# Patient Record
Sex: Male | Born: 1975 | Hispanic: Yes | Marital: Single | Smoking: Never smoker
Health system: Southern US, Community
[De-identification: ages and names within clinical notes are randomized; demographics above are authoritative.]

## PROBLEM LIST (undated history)

## (undated) DIAGNOSIS — Z789 Other specified health status: Secondary | ICD-10-CM

## (undated) HISTORY — DX: Other specified health status: Z78.9

## (undated) HISTORY — PX: OTHER SURGICAL HISTORY: SHX169

---

## 2008-12-12 ENCOUNTER — Emergency Department (HOSPITAL_COMMUNITY): Admission: EM | Admit: 2008-12-12 | Discharge: 2008-12-12 | Payer: Self-pay | Admitting: Emergency Medicine

## 2009-08-23 ENCOUNTER — Emergency Department (HOSPITAL_COMMUNITY): Admission: EM | Admit: 2009-08-23 | Discharge: 2009-08-23 | Payer: Self-pay | Admitting: Emergency Medicine
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
## 2011-03-07 LAB — BASIC METABOLIC PANEL
BUN: 14 mg/dL (ref 6–23)
CO2: 19 mEq/L (ref 19–32)
Calcium: 10 mg/dL (ref 8.4–10.5)
Chloride: 105 mEq/L (ref 96–112)
GFR calc Af Amer: >60 mL/min (ref >60)
GFR calc non Af Amer: >60 mL/min (ref >60)
Sodium: 137 mEq/L (ref 135–145)

## 2011-03-07 LAB — DIFFERENTIAL
Eosinophils Absolute: 0.3 10*3/uL (ref 0.0–0.7)
Lymphocytes Relative: 35 % (ref 12–46)
Monocytes Relative: 8 % (ref 3–12)

## 2011-03-07 LAB — CBC
Hemoglobin: 14.9 g/dL (ref 13.0–17.0)
MCHC: 33.7 g/dL (ref 30.0–36.0)
Platelets: 299 10*3/uL (ref 150–400)
RBC: 5 MIL/uL (ref 4.22–5.81)
WBC: 9.4 10*3/uL (ref 4.0–10.5)

## 2011-03-07 LAB — POCT CARDIAC MARKERS
CKMB, poc: <1 ng/mL — ABNORMAL LOW (ref 1.0–8.0)
Troponin i, poc: <0.05 ng/mL (ref 0.00–0.09)

## 2011-03-07 LAB — ETHANOL: Alcohol, Ethyl (B): <5 mg/dL (ref 0–10)

## 2014-06-14 ENCOUNTER — Encounter (HOSPITAL_COMMUNITY): Payer: Self-pay | Admitting: Emergency Medicine

## 2014-06-14 ENCOUNTER — Emergency Department (HOSPITAL_COMMUNITY)
Admission: EM | Admit: 2014-06-14 | Discharge: 2014-06-14 | Disposition: A | Discharge status: Home / Self Care | Payer: Self-pay | Attending: Emergency Medicine | Admitting: Emergency Medicine

## 2014-06-14 DIAGNOSIS — H9209 Otalgia, unspecified ear: Secondary | ICD-10-CM | POA: Insufficient documentation

## 2014-06-14 DIAGNOSIS — H60399 Other infective otitis externa, unspecified ear: Secondary | ICD-10-CM | POA: Insufficient documentation

## 2014-06-14 DIAGNOSIS — H6092 Unspecified otitis externa, left ear: Secondary | ICD-10-CM

## 2014-06-14 LAB — CBG MONITORING, ED: GLUCOSE-CAPILLARY: 102 mg/dL — AB (ref 70–99)

## 2014-06-14 MED ORDER — NEOMYCIN-POLYMYXIN-HC 1 % OT SOLN
4.0000 [drp] | Freq: Four times a day (QID) | OTIC | Status: DC
  Administered 2014-06-14: 4 [drp] via OTIC
  Filled 2014-06-14: qty 10

## 2014-06-14 NOTE — ED Provider Notes (Signed)
CSN: [id number]     Arrival date & time 06/14/14  0126 History   First MD Initiated Contact with Patient 06/14/14 [phone]     Chief Complaint  Patient presents with  . Earache      left     (Consider location/radiation/quality/duration/timing/severity/associated sxs/prior Treatment) HPI This is a 38 year old male without significant past medical history. He is here with a two-day history of pain in his left ear associated with drainage. He denies swimming. His pain is moderate to severe worse with movement of the ear. The pain radiates to the left side of the face around the ear. He denies fever.  History reviewed. No pertinent past medical history. History reviewed. No pertinent past surgical history. History reviewed. No pertinent family history. History  Substance Use Topics  . Smoking status: Never Smoker   . Smokeless tobacco: Not on file  . Alcohol Use: No    Review of Systems  All other systems reviewed and are negative.   Allergies  Review of patient's allergies indicates no known allergies.  Home Medications   Prior to Admission medications   Not on File   BP 146/87  Pulse 60  Temp(Src) 98 F (36.7 C) (Oral)  Resp 18  Ht 5\' 2"  (1.575 m)  Wt 160 lb (72.576 kg)  BMI 29.26 kg/m2  SpO2 100%  Physical Exam General: Well-developed, well-nourished male in no acute distress; appearance consistent with age of record HENT: normocephalic; atraumatic; right TM and external auditory canal normal; pain on movement of left external ear, edema and fluid of left external auditory canal, no periauricular erythema or lymphadenopathy Eyes: pupils equal, round and reactive to light; extraocular muscles intact Neck: supple; no lymphadenopathy  Heart: regular rate and rhythm Lungs: clear to auscultation bilaterally Abdomen: soft; nondistended; nontender Extremities: No deformity; full range of motion; pulses normal Neurologic: Awake, alert and oriented; motor function intact in  all extremities and symmetric; no facial droop Skin: Warm and dry Psychiatric: Normal mood and affect    ED Course  Procedures (including critical care time)  MDM  CBG 102    Carlisle BeersJohn L Khyri Hinzman, MD 06/14/14 0244

## 2014-06-14 NOTE — ED Notes (Signed)
Patient states his left ear started hurting Friday morning 06/13/2014. Patients states pain into left jaw/mouth and left neck pain. Patient states it hurts to open mouth wide. Patient also states decreased hearing to let ear

## 2014-06-14 NOTE — ED Notes (Signed)
Patient provided discharge instructions in spanish and verbalizes understanding of medication administration and pain management and home care. Patient ambulatory out of department at this time

## 2014-07-24 ENCOUNTER — Encounter (HOSPITAL_COMMUNITY): Payer: Self-pay | Admitting: Emergency Medicine

## 2014-07-24 ENCOUNTER — Emergency Department (HOSPITAL_COMMUNITY)
Admission: EM | Admit: 2014-07-24 | Discharge: 2014-07-24 | Disposition: A | Discharge status: Home / Self Care | Payer: BC Managed Care – PPO | Attending: Emergency Medicine | Admitting: Emergency Medicine

## 2014-07-24 DIAGNOSIS — K089 Disorder of teeth and supporting structures, unspecified: Secondary | ICD-10-CM | POA: Diagnosis present

## 2014-07-24 DIAGNOSIS — K0889 Other specified disorders of teeth and supporting structures: Secondary | ICD-10-CM

## 2014-07-24 MED ORDER — PENICILLIN V POTASSIUM 500 MG PO TABS: 500.0000 mg | ORAL_TABLET | Freq: Four times a day (QID) | ORAL | Status: AC

## 2014-07-24 MED ORDER — OXYCODONE-ACETAMINOPHEN 5-325 MG PO TABS
1.0000 | ORAL_TABLET | Freq: Once | ORAL | Status: AC
  Administered 2014-07-24: 1 via ORAL
  Filled 2014-07-24: qty 1

## 2014-07-24 MED ORDER — IBUPROFEN 800 MG PO TABS
800.0000 mg | ORAL_TABLET | Freq: Once | ORAL | Status: AC
  Administered 2014-07-24: 800 mg via ORAL
  Filled 2014-07-24: qty 1

## 2014-07-24 NOTE — ED Notes (Signed)
Patient c/o left lower tooth pain.  Patient holds up his hand and wiggles his fingers and states he can't move his hands or his body.  Patient stood from wheelchair and walked to bed.

## 2014-07-24 NOTE — Discharge Instructions (Signed)

## 2014-07-24 NOTE — ED Provider Notes (Signed)
CSN: [id number]     Arrival date & time 07/24/14  0026 History  This chart was scribed for Joya Gaskins, MD by Bronson Curb, ED Scribe. This patient was seen in room APA06/AP[facility]06 and the patient's care was started at 12:45 AM.    Chief Complaint  Patient presents with  . Dental Pain    Patient is a 38 y.o. male presenting with tooth pain. The history is provided by the patient. No language interpreter was used.  Dental Pain Location:  Lower Quality:  Radiating Severity:  Moderate Onset quality:  Sudden Timing:  Constant Progression:  Improving Chronicity:  New Relieved by:  None tried Worsened by:  Nothing tried Ineffective treatments:  None tried Associated symptoms: facial pain   Associated symptoms: no difficulty swallowing, no fever and no neck pain   Risk factors: no cancer and no diabetes     HPI Comments: Joseph Lester is a 38 y.o. male who presents to the Emergency Department complaining of constant, left lower dental pain onset 45 minutes ago. He states the pain radiates to his face and left eye. He reports the pain has improved since his arrival. He denies any modifying factors. Patient has no history of significant health conditions. Patient is a nonsmoker and has no history of EtOH consumption.  History reviewed. No pertinent past medical history. History reviewed. No pertinent past surgical history. No family history on file. History  Substance Use Topics  . Smoking status: Never Smoker   . Smokeless tobacco: Not on file  . Alcohol Use: No    Review of Systems  Constitutional: Negative for fever.  Musculoskeletal: Negative for neck pain.      Allergies  Review of patient's allergies indicates no known allergies.  Home Medications   Prior to Admission medications   Not on File   Triage Vitals: BP 137/89  Pulse 89  Temp(Src) 98.1 F (36.7 C) (Oral)  Resp 18  Ht  (1.549 m)  Wt 160 lb (72.576 kg)  BMI 30.25 kg/m2  SpO2  95%  Physical Exam CONSTITUTIONAL: Well developed/well nourished HEAD AND FACE: Normocephalic/atraumatic EYES: EOMI/PERRL ENMT: Mucous membranes moist.  Poor dentition.  No trismus.  No focal abscess noted. NECK: supple no meningeal signs CV: S1/S2 noted, no murmurs/rubs/gallops noted LUNGS: Lungs are clear to auscultation bilaterally, no apparent distress ABDOMEN: soft, nontender, no rebound or guarding NEURO: Pt is awake/alert, moves all extremitiesx4. No facial droop. No arm weakness noted. He is ambulatory EXTREMITIES:full ROM SKIN: warm, color normal   ED Course  Procedures  DIAGNOSTIC STUDIES: Oxygen Saturation is 95% on room air, adequate by my interpretation.    COORDINATION OF CARE: At 71 Discussed treatment plan with patient which includes Percocet and penicillin. Patient agrees.  Pt told nurse he couldn't move his body He told me he has had this previously but feels he can move his body currently He is ambulatory, no focal neuro deficits noted Stable for d/c home MDM   Final diagnoses:  Pain, dental    Nursing notes including past medical history and social history reviewed and considered in documentation   I personally performed the services described in this documentation, which was scribed in my presence. The recorded information has been reviewed and is accurate.      Joya Gaskins, MD 07/24/14 [phone]

## 2016-08-24 DIAGNOSIS — M25511 Pain in right shoulder: Secondary | ICD-10-CM

## 2016-08-24 DIAGNOSIS — M25512 Pain in left shoulder: Principal | ICD-10-CM

## 2016-08-24 LAB — GLUCOSE, POCT (MANUAL RESULT ENTRY): POC Glucose: 115 mg/dl — AB (ref 70–99)
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
## 2016-08-30 ENCOUNTER — Ambulatory Visit: Payer: Self-pay | Admitting: Physician Assistant

## 2016-08-31 ENCOUNTER — Telehealth: Payer: Self-pay

## 2016-08-31 NOTE — Telephone Encounter (Signed)
Follow up call was made on 08/31/16 at 9:54 am. Patient was in the [facility] on 08/24/16 with complaints of shoulder pain and back pain due to a work injury and wanted to have some blood work. [facility] was [facility] an appointment for 08/30/16 at 1:30. As I was talking to the patient, he said he was doing good at the moment. He also mentioned he called to cancel his appointment with the [facility] due to his job not giving him the day off. I advised him to call and rescheduled. I assured him that he will be able to leave a message since the Clinic has Spanish prompts and a there is a  Designer, multimediaBilingual nurse at the facility.   BogardBlanca Korryn Pancoast, [place] [id number] [phone]

## 2016-09-06 ENCOUNTER — Encounter: Payer: Self-pay | Admitting: Physician Assistant

## 2018-08-13 ENCOUNTER — Encounter (HOSPITAL_COMMUNITY): Payer: Self-pay | Admitting: Emergency Medicine

## 2018-08-13 ENCOUNTER — Emergency Department (HOSPITAL_COMMUNITY)
Admission: EM | Admit: 2018-08-13 | Discharge: 2018-08-13 | Disposition: A | Discharge status: Home / Self Care | Payer: Self-pay | Attending: Emergency Medicine | Admitting: Emergency Medicine

## 2018-08-13 DIAGNOSIS — R1010 Upper abdominal pain, unspecified: Secondary | ICD-10-CM | POA: Insufficient documentation

## 2018-08-13 LAB — COMPREHENSIVE METABOLIC PANEL
ALBUMIN: 4.4 g/dL (ref 3.5–5.0)
ALK PHOS: 75 U/L (ref 38–126)
ALT: 66 U/L — AB (ref 0–44)
AST: 39 U/L (ref 15–41)
Anion gap: 9 (ref 5–15)
BILIRUBIN TOTAL: 1.2 mg/dL (ref 0.3–1.2)
BUN: 12 mg/dL (ref 6–20)
CALCIUM: 9.1 mg/dL (ref 8.9–10.3)
CO2: 26 mmol/L (ref 22–32)
CREATININE: 0.67 mg/dL (ref 0.61–1.24)
Chloride: 103 mmol/L (ref 98–111)
GFR calc Af Amer: >60 mL/min (ref >60)
GFR calc non Af Amer: >60 mL/min (ref >60)
GLUCOSE: 108 mg/dL — AB (ref 70–99)
Potassium: 4 mmol/L (ref 3.5–5.1)
Sodium: 138 mmol/L (ref 135–145)
TOTAL PROTEIN: 7.8 g/dL (ref 6.5–8.1)

## 2018-08-13 LAB — CBC WITH DIFFERENTIAL/PLATELET
Basophils Absolute: 0 10*3/uL (ref 0.0–0.1)
Basophils Relative: 0 %
Eosinophils Absolute: 0.1 10*3/uL (ref 0.0–0.7)
Eosinophils Relative: 2 %
HCT: 44.1 % (ref 39.0–52.0)
Hemoglobin: 15.2 g/dL (ref 13.0–17.0)
Lymphocytes Relative: 24 %
Lymphs Abs: 1.5 10*3/uL (ref 0.7–4.0)
MCH: 30.1 pg (ref 26.0–34.0)
MCHC: 34.5 g/dL (ref 30.0–36.0)
MCV: 87.3 fL (ref 78.0–100.0)
Monocytes Absolute: 0.6 10*3/uL (ref 0.1–1.0)
Monocytes Relative: 10 %
Neutro Abs: 4 10*3/uL (ref 1.7–7.7)
Neutrophils Relative %: 64 %
Platelets: 256 10*3/uL (ref 150–400)
RBC: 5.05 MIL/uL (ref 4.22–5.81)
RDW: 12.6 % (ref 11.5–15.5)
WBC: 6.2 10*3/uL (ref 4.0–10.5)

## 2018-08-13 LAB — URINALYSIS, ROUTINE W REFLEX MICROSCOPIC
BILIRUBIN URINE: NEGATIVE
GLUCOSE, UA: NEGATIVE mg/dL
HGB URINE DIPSTICK: NEGATIVE
KETONES UR: NEGATIVE mg/dL
Leukocytes, UA: NEGATIVE
Nitrite: NEGATIVE
PROTEIN: NEGATIVE mg/dL
Specific Gravity, Urine: 1.018 (ref 1.005–1.030)
pH: 6 (ref 5.0–8.0)

## 2018-08-13 MED ORDER — ACETAMINOPHEN 500 MG PO TABS
1000.0000 mg | ORAL_TABLET | Freq: Once | ORAL | Status: AC
  Administered 2018-08-13: 1000 mg via ORAL
  Filled 2018-08-13: qty 2

## 2018-08-13 NOTE — Discharge Instructions (Signed)
Take Tylenol and Pepcid as needed for pain. Follow-up with local doctor and specialist as discussed.

## 2018-08-13 NOTE — ED Triage Notes (Signed)
Pt reports right sided abd pain for a few months now.  Was seen by pcp and given meds, but not helping.  No vomiting or diarrhea, and pain increases with eating.

## 2018-08-13 NOTE — ED Provider Notes (Signed)
[facility] EMERGENCY DEPARTMENT Provider Note   CSN: [id number] Arrival date & time: 08/13/18  0957     History   Chief Complaint Chief Complaint  Patient presents with  . Abdominal Pain    HPI Joseph Lester is a 42 y.o. male.  Patient presents with recurrent abdominal pain for over 2 months.  Patient saw primary doctor was given meds however they did not help.  Patient denies fevers chills or vomiting.  Pain fairly constant currently mild.  Patient's had mild dysuria.  No new sexual partners.  Pain worse with different foods.  Pain location upper abdomen right upper quadrant.     History reviewed. No pertinent past medical history.  There are no active problems to display for this patient.   History reviewed. No pertinent surgical history.      Home Medications    Prior to Admission medications   Not on File    Family History History reviewed. No pertinent family history.  Social History Social History   Tobacco Use  . Smoking status: Never Smoker  Substance Use Topics  . Alcohol use: No  . Drug use: No     Allergies   Patient has no known allergies.   Review of Systems Review of Systems  Constitutional: Negative for chills and fever.  HENT: Negative for congestion.   Eyes: Negative for visual disturbance.  Respiratory: Negative for shortness of breath.   Cardiovascular: Negative for chest pain.  Gastrointestinal: Positive for abdominal pain. Negative for vomiting.  Genitourinary: Positive for dysuria. Negative for flank pain.  Musculoskeletal: Negative for back pain, neck pain and neck stiffness.  Skin: Negative for rash.  Neurological: Negative for light-headedness and headaches.     Physical Exam Updated Vital Signs BP 115/83 (BP Location: Right Arm)   Pulse 72   Temp 98.2 F (36.8 C) (Oral)   Resp 16   Wt 70.8 kg   SpO2 100%   BMI 27.63 kg/m   Physical Exam  Constitutional: He is oriented to person, place, and time. He  appears well-developed and well-nourished.  HENT:  Head: Normocephalic and atraumatic.  Eyes: Conjunctivae are normal. Right eye exhibits no discharge. Left eye exhibits no discharge.  Neck: Normal range of motion. Neck supple. No tracheal deviation present.  Cardiovascular: Normal rate and regular rhythm.  Pulmonary/Chest: Effort normal.  Abdominal: Soft. He exhibits no distension. There is tenderness (minimal RUQ and epig). There is no guarding.  Musculoskeletal: He exhibits no edema.  Neurological: He is alert and oriented to person, place, and time.  Skin: Skin is warm. No rash noted.  Psychiatric: He has a normal mood and affect.  Nursing note and vitals reviewed.    ED Treatments / Results  Labs (all labs ordered are listed, but only abnormal results are displayed) Labs Reviewed  COMPREHENSIVE METABOLIC PANEL - Abnormal; Notable for the following components:      Result Value   Glucose, Bld 108 (*)    ALT 66 (*)    All other components within normal limits  CBC WITH DIFFERENTIAL/PLATELET  URINALYSIS, ROUTINE W REFLEX MICROSCOPIC  GC/CHLAMYDIA PROBE AMP ([place]) NOT AT [facility]    EKG None  Radiology No results found.  Procedures Ultrasound ED Abd Date/Time: 08/13/2018 12:31 PM Performed by: Blane Ohara, MD Authorized by: Blane Ohara, MD   Procedure details:    Indications: abdominal pain     Assessment for:  Gallstones   Hepatobiliary:  Visualized   Images: archived   Study  Limitations: bowel gas Hepatobiliary findings:    Gallbladder wall:  Normal   Gallbladder stones: not identified     Intra-abdominal fluid: not identified     (including critical care time)  Medications Ordered in ED Medications  acetaminophen (TYLENOL) tablet 1,000 mg (1,000 mg Oral Given 08/13/18 1145)     Initial Impression / Assessment and Plan / ED Course  I have reviewed the triage vital signs and the nursing notes.  Pertinent labs & imaging results that were  available during my care of the patient were reviewed by me and considered in my medical decision making (see chart for details).   Well-appearing male presents with recurrent abdominal pain for 2 months.  Patient blood work reviewed no significant findings, minimal elevation in LFTs.  Bedside ultrasound no gallstones or cholecystitis.  Discussed follow-up with primary doctor and gastroenterology.  Interpreter used for discussion and follow-up.  Results and differential diagnosis were discussed with the patient/parent/guardian. Xrays were independently reviewed by myself.  Close follow up outpatient was discussed, comfortable with the plan.   Medications  acetaminophen (TYLENOL) tablet 1,000 mg (1,000 mg Oral Given 08/13/18 1145)    Vitals:   08/13/18 1006 08/13/18 1007  BP: 115/83   Pulse: 72   Resp: 16   Temp: 98.2 F (36.8 C)   TempSrc: Oral   SpO2: 100%   Weight:  70.8 kg    Final diagnoses:  Upper abdominal pain     Final Clinical Impressions(s) / ED Diagnoses   Final diagnoses:  Upper abdominal pain    ED Discharge Orders    None       Blane OharaZavitz, Effa Yarrow, MD 08/13/18 1232

## 2018-08-14 LAB — GC/CHLAMYDIA PROBE AMP (~~LOC~~) NOT AT ARMC
CHLAMYDIA, DNA PROBE: NEGATIVE
Neisseria Gonorrhea: NEGATIVE

## 2018-08-20 ENCOUNTER — Encounter: Payer: Self-pay | Admitting: Internal Medicine

## 2018-11-29 ENCOUNTER — Other Ambulatory Visit: Payer: Self-pay | Admitting: *Deleted

## 2018-11-29 ENCOUNTER — Ambulatory Visit: Payer: Self-pay | Admitting: Gastroenterology

## 2018-11-29 ENCOUNTER — Encounter: Payer: Self-pay | Admitting: *Deleted

## 2018-11-29 ENCOUNTER — Other Ambulatory Visit (HOSPITAL_COMMUNITY)
Admission: RE | Admit: 2018-11-29 | Discharge: 2018-11-29 | Disposition: A | Discharge status: Home / Self Care | Payer: Self-pay | Source: Ambulatory Visit | Attending: Gastroenterology | Admitting: Gastroenterology

## 2018-11-29 ENCOUNTER — Encounter: Payer: Self-pay | Admitting: Gastroenterology

## 2018-11-29 VITALS — BP 104/58 | HR 69 | Temp 97.1°F | Ht 64.0 in | Wt 152.2 lb

## 2018-11-29 DIAGNOSIS — R1011 Right upper quadrant pain: Secondary | ICD-10-CM | POA: Insufficient documentation

## 2018-11-29 DIAGNOSIS — R300 Dysuria: Secondary | ICD-10-CM

## 2018-11-29 LAB — HEPATIC FUNCTION PANEL
ALBUMIN: 4.3 g/dL (ref 3.5–5.0)
ALK PHOS: 81 U/L (ref 38–126)
ALT: 73 U/L — AB (ref 0–44)
AST: 39 U/L (ref 15–41)
Bilirubin, Direct: <0.1 mg/dL (ref 0.0–0.2)
TOTAL PROTEIN: 7.7 g/dL (ref 6.5–8.1)
Total Bilirubin: 0.8 mg/dL (ref 0.3–1.2)

## 2018-11-29 MED ORDER — PANTOPRAZOLE SODIUM 40 MG PO TBEC: 40.0000 mg | DELAYED_RELEASE_TABLET | Freq: Every day | ORAL | 3 refills | Status: AC

## 2018-11-29 NOTE — Patient Instructions (Addendum)
Please start take taking Protonix 30 minutes before breakfast daily. Take the coupon to [facility] with the prescription so you can have a 90 day supply for 20 dollars.   I have ordered blood work to have done at the hospital today.  We are arranging an ultrasound in the near future.  We have referred you to a urologist.   Further recommendations to follow!  It was a pleasure to see you today. I strive to create trusting relationships with patients to provide genuine, compassionate, and quality care. I value your feedback. If you receive a survey regarding your visit,  I greatly appreciate you taking time to fill this out.   Joseph Mink, PhD, ANP-BC [facility]    Por favor, comience a tomar Protonix 30 minutos antes del desayuno todos [place]. Tome el cupn a [facility] con la receta para que pueda tener un suministro de [address] por 20 dlares.   He ordenado anlisis de sangre que hayan hecho en el hospital hoy.  Estamos organizando una ecografa en un futuro prximo.  Lo hemos referido a Engineer, manufacturing systems.   Ms recomendaciones a seguir!  Fue un Arboriculturist. Me esfuerzo por crear relaciones de confianza con los pacientes para proporcionar una atencin [place], [place] y de calidad. Valoro sus comentarios. Si recibe una encuesta con respecto a su visita, le agradezco enormemente que se tome el tiempo para completar esto.   Joseph Mink, PhD, ANP-BC [facility] [place]

## 2018-11-29 NOTE — Progress Notes (Signed)
Primary Care Physician:  Patient, No Pcp Per  Referring Physician: [facility] Primary Gastroenterologist:  Dr. Jena Gauss   Chief Complaint  Patient presents with  . Abdominal Pain    right side x couple months    HPI:   Joseph Lester is a 43 y.o. male presenting today at the request of [facility] due to abdominal pain. He speaks a small amount of English but needs an interpreter. He was seen in the ED Sept 2019. HFP with isolated mildly elevated ALT at 66. CBC normal. Bedside ultrasound was performed without obvious stones or concern for cholecystitis.   New onset of pain Sept 2019. Pain located in RUQ and middle abdomen. Constant. Worsened with eating. Worsened with coffee and soda. Slightly worsened with fried foods. No N/V. Feels a "coldness" in throat, and food has no taste when pain is occurring. No dysphagia. No weight loss. Decreased appetite.   Urinary burning and pain. No PCP. Normal UA and negative GC/chlamydia in Sept 2019.     Past Medical History:  Diagnosis Date  . Medical history non-contributory     Past Surgical History:  Procedure Laterality Date  . none      No current outpatient medications on file.   No current facility-administered medications for this visit.     Allergies as of 11/29/2018  . (No Known Allergies)    Family History  Problem Relation Age of Onset  . Colon cancer Neg Hx   . Colon polyps Neg Hx     Social History   Socioeconomic History  . Marital status: Single    Spouse name: Not on file  . Number of children: Not on file  . Years of education: Not on file  . Highest education level: Not on file  Occupational History  . Not on file  Social Needs  . Financial resource strain: Not on file  . Food insecurity:    Worry: Not on file    Inability: Not on file  . Transportation needs:    Medical: Not on file    Non-medical: Not on file  Tobacco Use  . Smoking status: Never Smoker  . Smokeless tobacco: Never Used    Substance and Sexual Activity  . Alcohol use: No  . Drug use: No  . Sexual activity: Not on file  Lifestyle  . Physical activity:    Days per week: Not on file    Minutes per session: Not on file  . Stress: Not on file  Relationships  . Social connections:    Talks on phone: Not on file    Gets together: Not on file    Attends religious service: Not on file    Active member of club or organization: Not on file    Attends meetings of clubs or organizations: Not on file    Relationship status: Not on file  . Intimate partner violence:    Fear of current or ex partner: Not on file    Emotionally abused: Not on file    Physically abused: Not on file    Forced sexual activity: Not on file  Other Topics Concern  . Not on file  Social History Narrative  . Not on file    Review of Systems: Gen: see HPI CV: Denies chest pain, heart palpitations, peripheral edema, syncope.  Resp: Denies shortness of breath at rest or with exertion. Denies wheezing or cough.  GI: see HPI  GU : Denies urinary burning,  urinary frequency, urinary hesitancy MS: Denies joint pain, muscle weakness, cramps, or limitation of movement.  Derm: Denies rash, itching, dry skin Psych: Denies depression, anxiety, memory loss, and confusion Heme: Denies bruising, bleeding, and enlarged lymph nodes.  Physical Exam: BP (!) 104/58   Pulse 69   Temp (!) 97.1 F (36.2 C) (Oral)   Ht 5\' 4"  (1.626 m)   Wt 152 lb 3.2 oz (69 kg)   BMI 26.13 kg/m  General:   Alert and oriented. Pleasant and cooperative. Well-nourished and well-developed.  Head:  Normocephalic and atraumatic. Eyes:  Without icterus, sclera clear and conjunctiva pink.  Ears:  Normal auditory acuity. Nose:  No deformity, discharge,  or lesions. Mouth:  No deformity or lesions, oral mucosa pink.  Lungs:  Clear to auscultation bilaterally. No wheezes, rales, or rhonchi. No distress.  Heart:  S1, S2 present without murmurs appreciated.  Abdomen:  +BS,  soft, mild TTP epigastric and non-distended. No HSM noted. No guarding or rebound. No masses appreciated.  Rectal:  Deferred  Msk:  Symmetrical without gross deformities. Normal posture. Extremities:  Without edema. Neurologic:  Alert and  oriented x4 Psych:  Alert and cooperative. Normal mood and affect.   Ultrasound ED Abd Date/Time: 08/13/2018 12:31 PM Performed by: Blane OharaZavitz, Joshua, MD Authorized by: Blane OharaZavitz, Joshua, MD   Procedure details:    Indications: abdominal pain     Assessment for:  Gallstones   Hepatobiliary:  Visualized   Images: archived   Study Limitations: bowel gas Hepatobiliary findings:    Gallbladder wall:  Normal   Gallbladder stones: not identified     Intra-abdominal fluid: not identified

## 2018-12-03 NOTE — Assessment & Plan Note (Addendum)
43 year old with mildly isolated elevated ALT at 66. Limited ultrasound completed at bedside by ED. Differentials including biliary, less likely gastritis/PUD. To be thorough ordering US abdomen complete, recheck HFP, may need HIDA scan. Follow transaminases. May need to check hepatitis serologies as well. Starting Protonix once each day, with good rx coupon provided. Further recommendations to follow.   Due to urinary symptoms: refer to Urology. We have also provided a list of PCPs in the area to establish care.   Speaks very limited [place]. Entire visit completed with interpreter via telephone. For future visits, would highly suggest in-person interpreter if at all possible.

## 2018-12-06 NOTE — Progress Notes (Signed)
Isolated elevation of ALT. Awaiting ultrasound.

## 2018-12-07 ENCOUNTER — Ambulatory Visit (HOSPITAL_COMMUNITY)
Admission: RE | Admit: 2018-12-07 | Discharge: 2018-12-07 | Disposition: A | Discharge status: Home / Self Care | Payer: Self-pay | Source: Ambulatory Visit | Attending: Gastroenterology | Admitting: Gastroenterology

## 2018-12-07 DIAGNOSIS — R1011 Right upper quadrant pain: Secondary | ICD-10-CM | POA: Insufficient documentation

## 2018-12-13 ENCOUNTER — Other Ambulatory Visit: Payer: Self-pay | Admitting: Gastroenterology

## 2018-12-13 DIAGNOSIS — R1011 Right upper quadrant pain: Secondary | ICD-10-CM

## 2018-12-13 DIAGNOSIS — R740 Nonspecific elevation of levels of transaminase and lactic acid dehydrogenase [LDH]: Principal | ICD-10-CM

## 2018-12-13 DIAGNOSIS — R7401 Elevation of levels of liver transaminase levels: Secondary | ICD-10-CM

## 2018-12-13 NOTE — Progress Notes (Signed)
WILL NEED AN INTERPRETER: US abdomen with gallbladder polyps that are small. No gallstones. He has a fatty liver. His ALT remains elevated. He needs Hep C antibody, Hep B serologies,  Hep A antibody, which I have ordered and just need to be released from chart. I would also recommend a HIDA scan to be thorough, due to his postprandial RUQ pain, N/V, IF IT IS STILL PRESENT since starting Protonix. Hopefully, this has helped.

## 2018-12-13 NOTE — Progress Notes (Signed)
Labs entered. See result note from [place].

## 2018-12-14 ENCOUNTER — Other Ambulatory Visit: Payer: Self-pay

## 2018-12-14 DIAGNOSIS — R112 Nausea with vomiting, unspecified: Secondary | ICD-10-CM

## 2018-12-14 DIAGNOSIS — R7401 Elevation of levels of liver transaminase levels: Secondary | ICD-10-CM

## 2018-12-14 DIAGNOSIS — R740 Nonspecific elevation of levels of transaminase and lactic acid dehydrogenase [LDH]: Principal | ICD-10-CM

## 2018-12-14 DIAGNOSIS — R1011 Right upper quadrant pain: Secondary | ICD-10-CM

## 2018-12-21 ENCOUNTER — Ambulatory Visit (HOSPITAL_COMMUNITY)
Admission: RE | Admit: 2018-12-21 | Discharge: 2018-12-21 | Disposition: A | Discharge status: Home / Self Care | Payer: Self-pay | Source: Ambulatory Visit | Attending: Gastroenterology | Admitting: Gastroenterology

## 2018-12-21 ENCOUNTER — Other Ambulatory Visit: Payer: Self-pay | Admitting: Gastroenterology

## 2018-12-21 ENCOUNTER — Encounter (HOSPITAL_COMMUNITY): Payer: Self-pay

## 2018-12-21 DIAGNOSIS — R112 Nausea with vomiting, unspecified: Secondary | ICD-10-CM | POA: Insufficient documentation

## 2018-12-21 DIAGNOSIS — R1011 Right upper quadrant pain: Secondary | ICD-10-CM | POA: Insufficient documentation

## 2018-12-21 MED ORDER — SODIUM CHLORIDE 0.9% FLUSH
INTRAVENOUS | Status: AC
  Filled 2018-12-21: qty 40

## 2018-12-21 MED ORDER — STERILE WATER FOR INJECTION IJ SOLN
INTRAMUSCULAR | Status: AC
  Administered 2018-12-21: 1.38 mL via INTRAVENOUS
  Filled 2018-12-21: qty 10

## 2018-12-21 MED ORDER — SINCALIDE 5 MCG IJ SOLR
INTRAMUSCULAR | Status: AC
  Administered 2018-12-21: 1.38 ug via INTRAVENOUS
  Filled 2018-12-21: qty 5

## 2018-12-21 MED ORDER — TECHNETIUM TC 99M MEBROFENIN IV KIT
5.0000 | PACK | Freq: Once | INTRAVENOUS | Status: AC | PRN
  Administered 2018-12-21: 5.2 via INTRAVENOUS

## 2018-12-26 NOTE — Progress Notes (Signed)
HIDA scan was normal. EF of 65%. He still needs to complete labs. How is he doing on Protonix?

## 2019-01-02 NOTE — Progress Notes (Signed)
At this point, recommend continue Protonix, please complete labs, and we need to arrange EGD with Dr. Jena Gauss due to abdominal pain.

## 2019-01-03 ENCOUNTER — Telehealth: Payer: Self-pay

## 2019-01-03 NOTE — Telephone Encounter (Signed)
Per [facility], [facility] has tried to call pt to schedule appt but unable to leave VM. Called pt, he was driving, but he has [facility] phone# in is in phone where they tried to call. Advised him to call urology to schedule appt.

## 2019-01-09 ENCOUNTER — Other Ambulatory Visit: Payer: Self-pay

## 2019-01-09 DIAGNOSIS — R109 Unspecified abdominal pain: Secondary | ICD-10-CM

## 2019-02-28 ENCOUNTER — Telehealth: Payer: Self-pay | Admitting: *Deleted

## 2019-02-28 NOTE — Telephone Encounter (Signed)
ATC pt to move procedure time up to 8:30am on 03/08/2019. Patient VM is full.

## 2019-02-28 NOTE — Telephone Encounter (Signed)
Called interpreter line and spoke with [place] ID# [phone]. We spoke with patient and he is aware he needs to arrive on 03/08/2019 at 7:30am. Patient lost his instructions. Discussed instructions in detail with interpreter with patient.  Patient aware he will need to start clear liquids at 6pm on 03/07/2019 and can have clear liquids until midnight. Nothing after midnight until after procedure. He voiced understanding.

## 2019-03-08 ENCOUNTER — Ambulatory Visit (HOSPITAL_COMMUNITY)
Admission: RE | Admit: 2019-03-08 | Discharge: 2019-03-08 | Disposition: A | Discharge status: Home / Self Care | Payer: Self-pay | Attending: Internal Medicine | Admitting: Internal Medicine

## 2019-03-08 ENCOUNTER — Encounter (HOSPITAL_COMMUNITY)
Admission: RE | Disposition: A | Discharge status: Home / Self Care | Payer: Self-pay | Source: Home / Self Care | Attending: Internal Medicine

## 2019-03-08 ENCOUNTER — Other Ambulatory Visit: Payer: Self-pay

## 2019-03-08 ENCOUNTER — Encounter (HOSPITAL_COMMUNITY): Payer: Self-pay | Admitting: *Deleted

## 2019-03-08 DIAGNOSIS — K824 Cholesterolosis of gallbladder: Secondary | ICD-10-CM | POA: Insufficient documentation

## 2019-03-08 DIAGNOSIS — K760 Fatty (change of) liver, not elsewhere classified: Secondary | ICD-10-CM | POA: Insufficient documentation

## 2019-03-08 DIAGNOSIS — R1011 Right upper quadrant pain: Secondary | ICD-10-CM | POA: Insufficient documentation

## 2019-03-08 DIAGNOSIS — R109 Unspecified abdominal pain: Secondary | ICD-10-CM

## 2019-03-08 HISTORY — PX: ESOPHAGOGASTRODUODENOSCOPY: SHX5428

## 2019-03-08 LAB — HEPATIC FUNCTION PANEL
ALT: 76 U/L — ABNORMAL HIGH (ref 0–44)
AST: 46 U/L — ABNORMAL HIGH (ref 15–41)
Albumin: 3.7 g/dL (ref 3.5–5.0)
Alkaline Phosphatase: 99 U/L (ref 38–126)
Bilirubin, Direct: <0.1 mg/dL (ref 0.0–0.2)
Total Bilirubin: 1 mg/dL (ref 0.3–1.2)
Total Protein: 6.9 g/dL (ref 6.5–8.1)

## 2019-03-08 SURGERY — EGD (ESOPHAGOGASTRODUODENOSCOPY)
Anesthesia: Moderate Sedation

## 2019-03-08 MED ORDER — LIDOCAINE VISCOUS HCL 2 % MT SOLN
OROMUCOSAL | Status: DC | PRN
  Administered 2019-03-08: 1 via OROMUCOSAL

## 2019-03-08 MED ORDER — STERILE WATER FOR IRRIGATION IR SOLN
Status: DC | PRN
  Administered 2019-03-08: 1.5 mL

## 2019-03-08 MED ORDER — MEPERIDINE HCL 100 MG/ML IJ SOLN
INTRAMUSCULAR | Status: DC | PRN
  Administered 2019-03-08: 15 mg via INTRAVENOUS
  Administered 2019-03-08: 25 mg via INTRAVENOUS

## 2019-03-08 MED ORDER — MEPERIDINE HCL 50 MG/ML IJ SOLN
INTRAMUSCULAR | Status: AC
  Filled 2019-03-08: qty 1

## 2019-03-08 MED ORDER — LIDOCAINE VISCOUS HCL 2 % MT SOLN
OROMUCOSAL | Status: AC
  Filled 2019-03-08: qty 15

## 2019-03-08 MED ORDER — MIDAZOLAM HCL 5 MG/5ML IJ SOLN
INTRAMUSCULAR | Status: DC | PRN
  Administered 2019-03-08: 2 mg via INTRAVENOUS
  Administered 2019-03-08: 1 mg via INTRAVENOUS

## 2019-03-08 MED ORDER — ONDANSETRON HCL 4 MG/2ML IJ SOLN
INTRAMUSCULAR | Status: AC
  Filled 2019-03-08: qty 2

## 2019-03-08 MED ORDER — SODIUM CHLORIDE 0.9 % IV SOLN
INTRAVENOUS | Status: DC
  Administered 2019-03-08: 09:00:00 via INTRAVENOUS

## 2019-03-08 MED ORDER — ONDANSETRON HCL 4 MG/2ML IJ SOLN
INTRAMUSCULAR | Status: DC | PRN
  Administered 2019-03-08: 4 mg via INTRAVENOUS

## 2019-03-08 MED ORDER — MIDAZOLAM HCL 5 MG/5ML IJ SOLN
INTRAMUSCULAR | Status: AC
  Filled 2019-03-08: qty 10

## 2019-03-08 NOTE — H&P (Signed)
 @LOGO @   Primary Care Physician:  Gelene MinkBoone, Anna W, NP Primary Gastroenterologist:  Dr.   Pre-Procedure History & Physical: HPI:  Joseph Lester is a 43 y.o. male here for   Past Medical History:  Diagnosis Date  . Medical history non-contributory     Past Surgical History:  Procedure Laterality Date  . none      Prior to Admission medications   Medication Sig Start Date End Date Taking? Authorizing Provider  pantoprazole (PROTONIX) 40 MG tablet Take 1 tablet (40 mg total) by mouth daily before breakfast. Patient not taking: Reported on 02/22/2019 11/29/18   Gelene MinkBoone, Anna W, NP    Allergies as of 01/09/2019  . (No Known Allergies)    Family History  Problem Relation Age of Onset  . Colon cancer Neg Hx   . Colon polyps Neg Hx     Social History   Socioeconomic History  . Marital status: Single    Spouse name: Not on file  . Number of children: Not on file  . Years of education: Not on file  . Highest education level: Not on file  Occupational History  . Not on file  Social Needs  . Financial resource strain: Not on file  . Food insecurity:    Worry: Not on file    Inability: Not on file  . Transportation needs:    Medical: Not on file    Non-medical: Not on file  Tobacco Use  . Smoking status: Never Smoker  . Smokeless tobacco: Never Used  Substance and Sexual Activity  . Alcohol use: No  . Drug use: No  . Sexual activity: Not on file  Lifestyle  . Physical activity:    Days per week: Not on file    Minutes per session: Not on file  . Stress: Not on file  Relationships  . Social connections:    Talks on phone: Not on file    Gets together: Not on file    Attends religious service: Not on file    Active member of club or organization: Not on file    Attends meetings of clubs or organizations: Not on file    Relationship status: Not on file  . Intimate partner violence:    Fear of current or ex partner: Not on file    Emotionally abused: Not on file   Physically abused: Not on file    Forced sexual activity: Not on file  Other Topics Concern  . Not on file  Social History Narrative  . Not on file    Review of Systems: See HPI, otherwise negative ROS  Physical Exam: BP 112/82   Pulse (!) 59   Temp 97.9 F (36.6 C) (Oral)   Resp 14   Ht 5\' 4"  (1.626 m)   Wt 68.9 kg   SpO2 97%   BMI 26.09 kg/m  General:   Alert,  Well-developed, well-nourished, pleasant and cooperative in NAD Skin:  Intact without significant lesions or rashes. Eyes:  Sclera clear, no icterus.   Conjunctiva pink. Ears:  Normal auditory acuity. Nose:  No deformity, discharge,  or lesions. Mouth:  No deformity or lesions. Neck:  Supple; no masses or thyromegaly. No significant cervical adenopathy. Lungs:  Clear throughout to auscultation.   No wheezes, crackles, or rhonchi. No acute distress. Heart:  Regular rate and rhythm; no murmurs, clicks, rubs,  or gallops. Abdomen: Non-distended, normal bowel sounds.  Soft and nontender without appreciable mass or hepatosplenomegaly.  Pulses:  Normal  pulses noted. Extremities:  Without clubbing or edema.  Impression/Plan: 43 year old gentleman with right upper quadrant abdominal pain.  Essentially unchanged for 4 months.  Gallbladder ultrasound 5 mm gallbladder polyp no stones.  Fatty liver.  Mildly elevated AST.  Patient does not follow through on lab work recommended.Marland Kitchen  He is not taking Protonix either.  No dysphagia.  I have offered him an EGD per plan.  Recommendations: Diagnostic EGD today.  The risks, benefits, limitations, alternatives and imponderables have been reviewed with the patient. Potential for esophageal dilation, biopsy, etc. have also been reviewed.  Questions have been answered. All parties agreeable.   Notice: This dictation was prepared with Dragon dictation along with smaller phrase technology. Any transcriptional errors that result from this process are unintentional and may not be corrected upon  review.

## 2019-03-08 NOTE — OR Nursing (Signed)
[employer] interpreter Elita Quick [phone] used for pre-op assessment. Patient states he's had a dry cough for 2 weeks and had a fever a month ago and went to Urgent care and they told him he had the flu. Temperature on arrival 97.9-orally. Dr. Jena Gauss notified.

## 2019-03-08 NOTE — Discharge Instructions (Signed)
EGD Discharge instructions Please read the instructions outlined below and refer to this sheet in the next few weeks. These discharge instructions provide you with general information on caring for yourself after you leave the hospital. Your doctor may also give you specific instructions. While your treatment has been planned according to the most current medical practices available, unavoidable complications occasionally occur. If you have any problems or questions after discharge, please call your doctor.Dr Jena Gauss:  [phone] ACTIVITY  You may resume your regular activity but move at a slower pace for the next 24 hours.   Take frequent rest periods for the next 24 hours.   Walking will help expel (get rid of) the air and reduce the bloated feeling in your abdomen.   No driving for 24 hours (because of the anesthesia (medicine) used during the test).   You may shower.   Do not sign any important legal documents or operate any machinery for 24 hours (because of the anesthesia used during the test).  NUTRITION  Drink plenty of fluids.   You may resume your normal diet.   Begin with a light meal and progress to your normal diet.   Avoid alcoholic beverages for 24 hours or as instructed by your caregiver.  MEDICATIONS  You may resume your normal medications unless your caregiver tells you otherwise.  WHAT YOU CAN EXPECT TODAY  You may experience abdominal discomfort such as a feeling of fullness or gas pains.  FOLLOW-UP  Your doctor will discuss the results of your test with you.  SEEK IMMEDIATE MEDICAL ATTENTION IF ANY OF THE FOLLOWING OCCUR:  Excessive nausea (feeling sick to your stomach) and/or vomiting.   Severe abdominal pain and distention (swelling).   Trouble swallowing.   Temperature over 101 F (37.8 C).   Rectal bleeding or vomiting of blood.   Your EGD was normal today.  Good news.  I would like you to take Protonix 40 mg daily until your next office  visit  Draw blood for hepatic profile, hepatitis B surface antigen and hepatitis C antibody today.  Office visit with [place] in 3 months

## 2019-03-08 NOTE — Op Note (Signed)
[facility] Patient Name: Joseph Lester Procedure Date: 03/08/2019 8:15 AM MRN: [id number] Date of Birth: [birth year] Attending MD: Gennette Pac , MD CSN: [id number] Age: 43 Admit Type: Outpatient Procedure:                Upper GI endoscopy Indications:              Abdominal pain in the right upper quadrant Providers:                Gennette Pac, MD, Buel Ream. Thomasena Edis RN, RN,                            Pandora Leiter, Technician, Dyann Ruddle Referring MD:              Medicines:                Midazolam 3 mg IV, Meperidine 40 mg IV, Ondansetron                            4 mg IV Complications:            No immediate complications. Estimated Blood Loss:     Estimated blood loss: none. Procedure:                Pre-Anesthesia Assessment:                           - Prior to the procedure, a History and Physical                            was performed, and patient medications and                            allergies were reviewed. The patient's tolerance of                            previous anesthesia was also reviewed. The risks                            and benefits of the procedure and the sedation                            options and risks were discussed with the patient.                            All questions were answered, and informed consent                            was obtained. Prior Anticoagulants: The patient has                            taken no previous anticoagulant or antiplatelet                            agents. ASA Grade Assessment: II - A patient with  mild systemic disease. After reviewing the risks                            and benefits, the patient was deemed in                            satisfactory condition to undergo the procedure.                           After obtaining informed consent, the endoscope was                            passed under direct vision. Throughout the        procedure, the patient's blood pressure, pulse, and                            oxygen saturations were monitored continuously. The                            GIF-[id number] ([id number]) was introduced through the                            mouth, and advanced to the second part of duodenum.                            The upper GI endoscopy was accomplished without                            difficulty. The patient tolerated the procedure                            well. Scope In: 9:05:23 AM Scope Out: 9:08:13 AM Total Procedure Duration: 0 hours 2 minutes 50 seconds  Findings:      The examined esophagus was normal.      The entire examined stomach was normal.      The duodenal bulb and second portion of the duodenum were normal.       Estimated blood loss: none. Impression:               - Normal esophagus.                           - Normal stomach.                           - Normal duodenal bulb and second portion of the                            duodenum.                           - No specimens collected. Moderate Sedation:      Moderate (conscious) sedation was administered by the endoscopy nurse       and supervised by the endoscopist. The following parameters were       monitored: oxygen saturation, heart rate, blood  pressure, respiratory       rate, EKG, adequacy of pulmonary ventilation, and response to care.       Total physician intraservice time was 13 minutes. Recommendation:           - Patient has a contact number available for                            emergencies. The signs and symptoms of potential                            delayed complications were discussed with the                            patient. Return to normal activities tomorrow.                            Written discharge instructions were provided to the                            patient.                           - Advance diet as tolerated. Resume Protonix 40 mg                            daily.  Hepatitis B surface antigen and hepatitis C                            antibody today. Hepatic profile today. Office visit                            with [place] in 3 months                           - Continue present medications. Procedure Code(s):        --- Professional ---                           [phone], Esophagogastroduodenoscopy, flexible,                            transoral; diagnostic, including collection of                            specimen(s) by brushing or washing, when performed                            (separate procedure)                           G0500, Moderate sedation services provided by the                            same physician or other qualified health care  professional performing a gastrointestinal                            endoscopic service that sedation supports,                            requiring the presence of an independent trained                            observer to assist in the monitoring of the                            patient's level of consciousness and physiological                            status; initial 15 minutes of intra-service time;                            patient age 67 years or older (additional time may                            be reported with [id number], as appropriate) Diagnosis Code(s):        --- Professional ---                           R10.11, Right upper quadrant pain CPT copyright 2019 American Medical Association. All rights reserved. The codes documented in this report are preliminary and upon coder review may  be revised to meet current compliance requirements. Gerrit Friendsobert M. Rourk, MD Gennette Pacobert Michael Rourk, MD 03/08/2019 9:21:37 AM This report has been signed electronically. Number of Addenda: 0

## 2019-03-09 LAB — HEPATITIS C ANTIBODY: HCV Ab: 0.1 s/co ratio (ref 0.0–0.9)

## 2019-03-09 LAB — HEPATITIS B SURFACE ANTIGEN: Hepatitis B Surface Ag: NEGATIVE

## 2019-03-11 ENCOUNTER — Encounter (HOSPITAL_COMMUNITY): Payer: Self-pay | Admitting: Internal Medicine

## 2019-03-18 NOTE — Progress Notes (Signed)
Joseph Lester, can we bump up his appt from July and do a telephone visit WITH INTERPRETER in next few weeks? I"m not sure how that would work with telehelath.

## 2019-03-28 ENCOUNTER — Encounter: Payer: Self-pay | Admitting: Gastroenterology

## 2019-03-28 ENCOUNTER — Other Ambulatory Visit: Payer: Self-pay

## 2019-03-28 ENCOUNTER — Ambulatory Visit: Payer: Self-pay | Admitting: Gastroenterology

## 2019-03-28 NOTE — Progress Notes (Deleted)
    Primary Care Physician:  Gelene Mink, NP  Primary GI: Dr. Jena Gauss   Virtual Visit via Telephone Note Due to COVID-19, visit is conducted virtually and was requested by patient.   I connected with Joseph Lester on 03/28/19 at 10:00 AM EDT by telephone and verified that I am speaking with the correct person using two identifiers.   I discussed the limitations, risks, security and privacy concerns of performing an evaluation and management service by telephone and the availability of in person appointments. I also discussed with the patient that there may be a patient responsible charge related to this service. The patient expressed understanding and agreed to proceed.  No chief complaint on file.    History of Present Illness: 43 year old male with history of RUQ abdominal pain, mildly elevated transaminases. EGD April 2020 normal. Hep B and C markers negative. Mildly elevated transaminases. Small polyps in gallbladder, largest measuring 5 mm. Fatty liver. HIDA scan normal.   Past Medical History:  Diagnosis Date  . Medical history non-contributory      Past Surgical History:  Procedure Laterality Date  . ESOPHAGOGASTRODUODENOSCOPY N/A 03/08/2019   normal  . none       No outpatient medications have been marked as taking for the 03/28/19 encounter (Appointment) with Gelene Mink, NP.      Review of Systems: Gen: Denies fever, chills, anorexia. Denies fatigue, weakness, weight loss.  CV: Denies chest pain, palpitations, syncope, peripheral edema, and claudication. Resp: Denies dyspnea at rest, cough, wheezing, coughing up blood, and pleurisy. GI: see HPI Derm: Denies rash, itching, dry skin Psych: Denies depression, anxiety, memory loss, confusion. No homicidal or suicidal ideation.  Heme: Denies bruising, bleeding, and enlarged lymph nodes.  Observations/Objective: No distress. Unable to perform physical exam due to telephone encounter. No video available.    Assessment and Plan:   Follow Up Instructions:    I discussed the assessment and treatment plan with the patient. The patient was provided an opportunity to ask questions and all were answered. The patient agreed with the plan and demonstrated an understanding of the instructions.   The patient was advised to call back or seek an in-person evaluation if the symptoms worsen or if the condition fails to improve as anticipated.  I provided *** minutes of non-face-to-face time during this encounter.  Gelene Mink, PhD, ANP-BC [facility]

## 2019-03-29 NOTE — Progress Notes (Signed)
Opened in error.  Patient was no show.

## 2019-04-01 ENCOUNTER — Telehealth: Payer: Self-pay | Admitting: Internal Medicine

## 2019-04-01 ENCOUNTER — Encounter: Payer: Self-pay | Admitting: Internal Medicine

## 2019-04-01 NOTE — Telephone Encounter (Signed)
PATIENT NO SHOW/NO ANSWER-LETTER SENT  °

## 2019-06-19 ENCOUNTER — Ambulatory Visit: Payer: Self-pay | Admitting: Gastroenterology

## 2020-04-17 IMAGING — US US ABDOMEN COMPLETE
1 series · 13 of 25 positions shown · non-contrast
Comparison: None.

CLINICAL DATA: Right upper quadrant pain.  Elevated liver enzymes

EXAM:
ABDOMEN ULTRASOUND COMPLETE

[Series 1: us abdomen complete · 0.18mm/px · 13 of 98 slices shown]
[im 1/98]
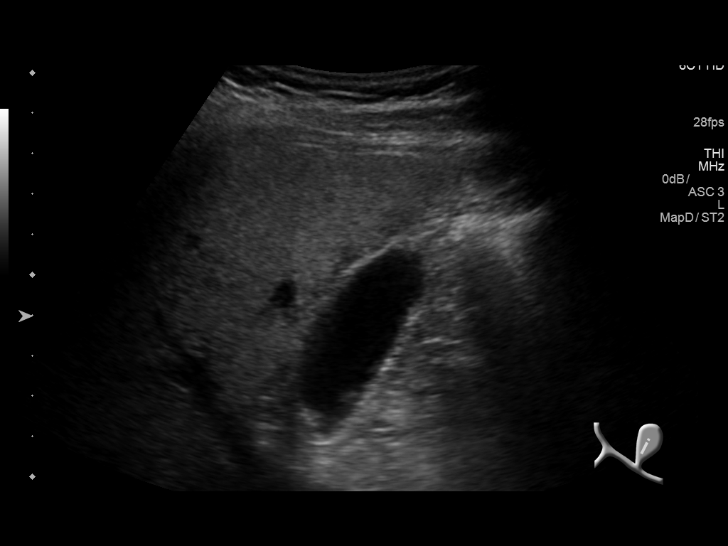
[im 9/98]
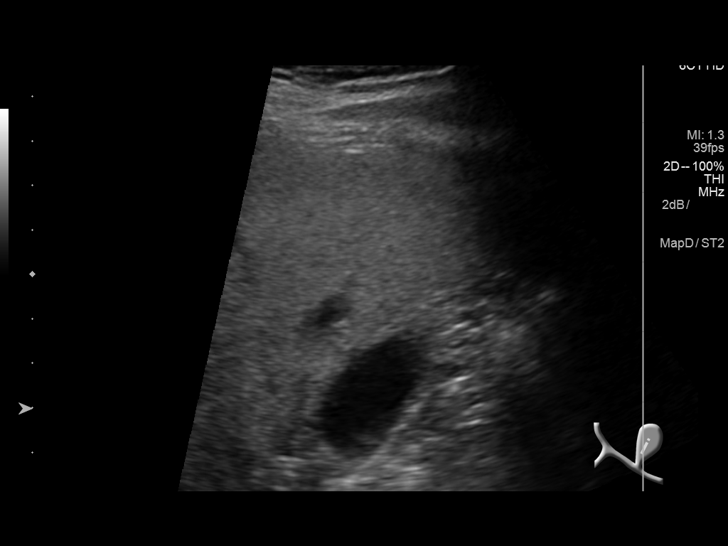
[im 17/98]
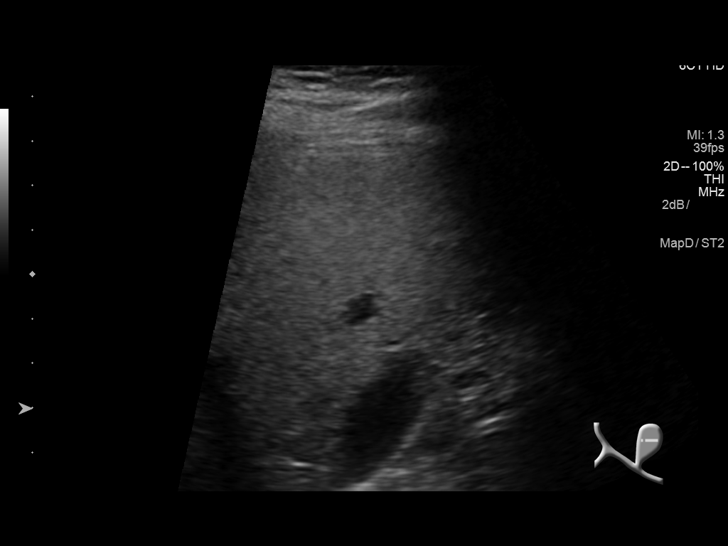
[im 25/98]
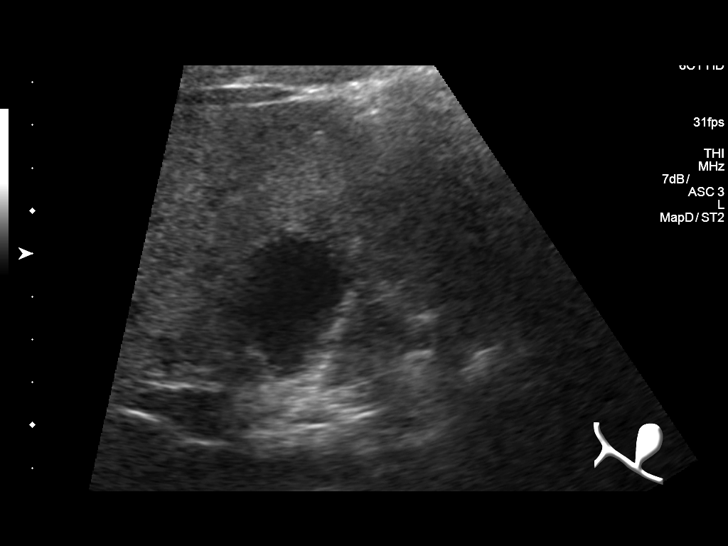
[im 33/98]
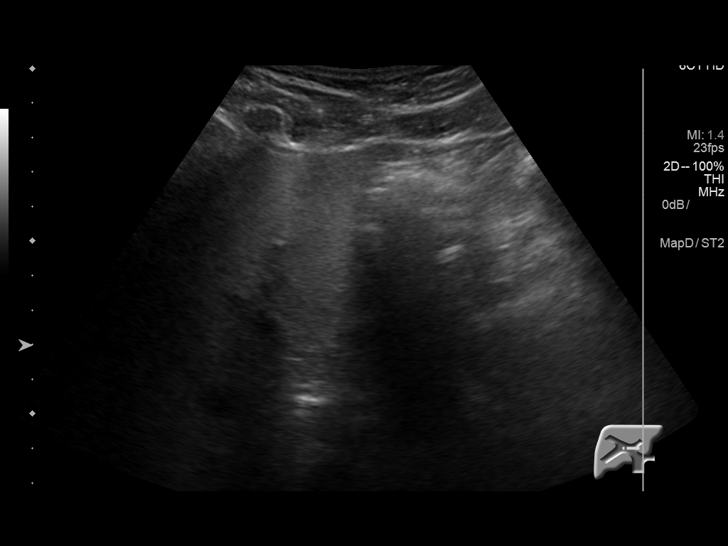
[im 41/98]
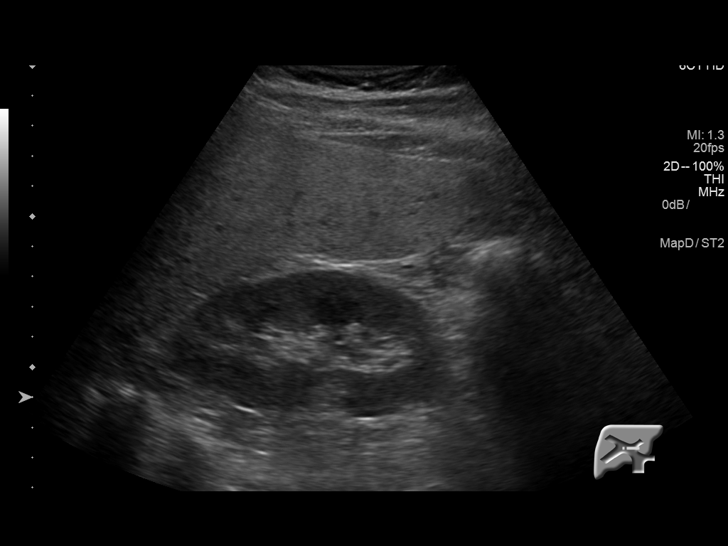
[im 49/98]
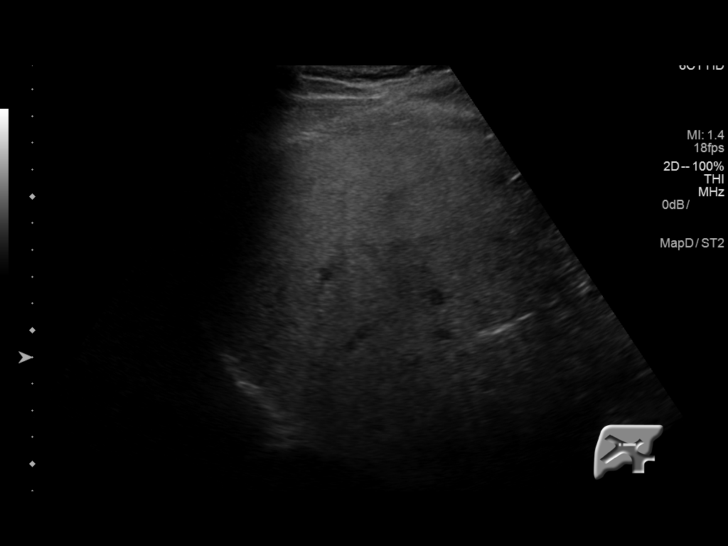
[im 57/98]
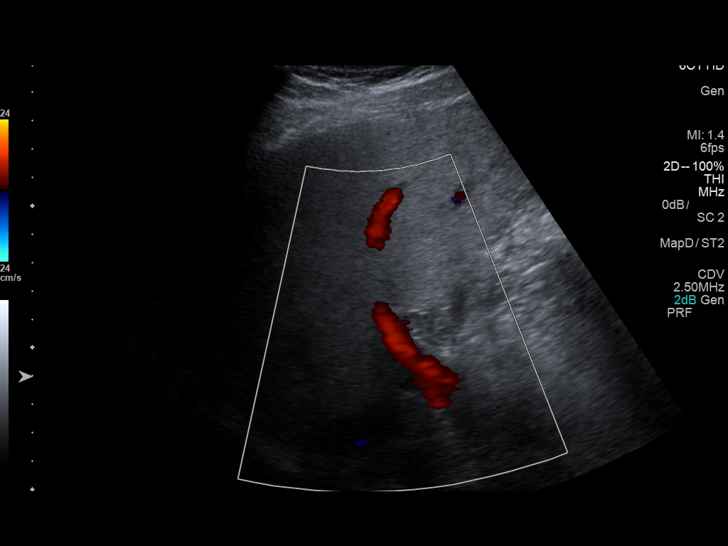
[im 65/98]
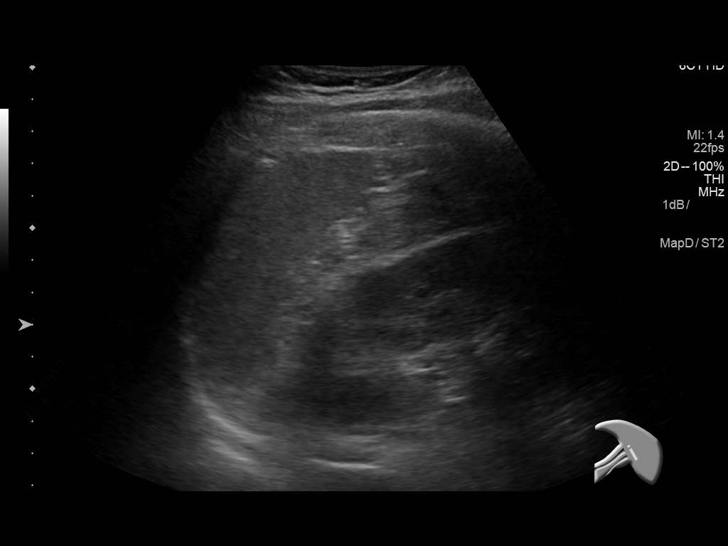
[im 73/98]
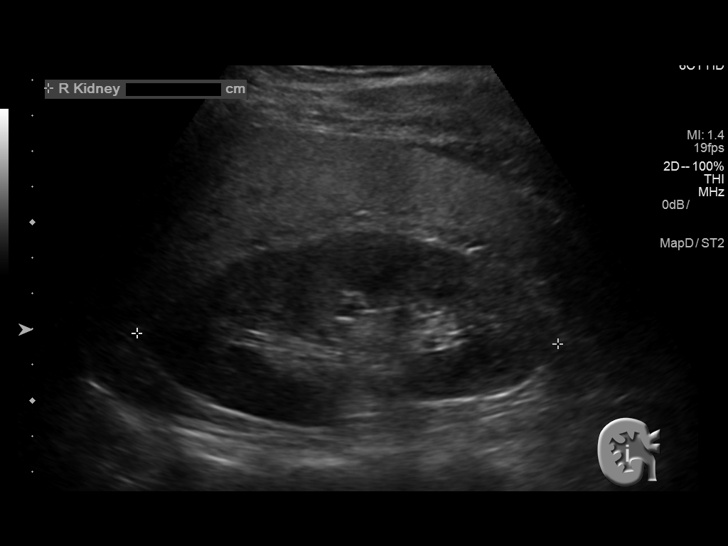
[im 81/98]
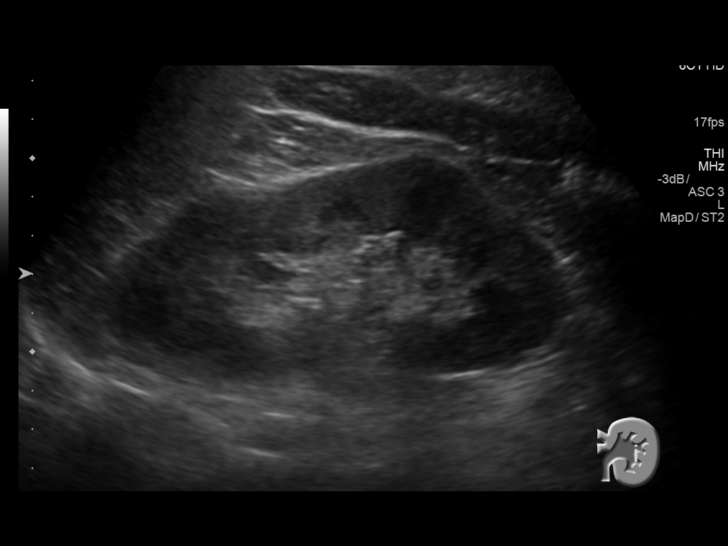
[im 89/98]
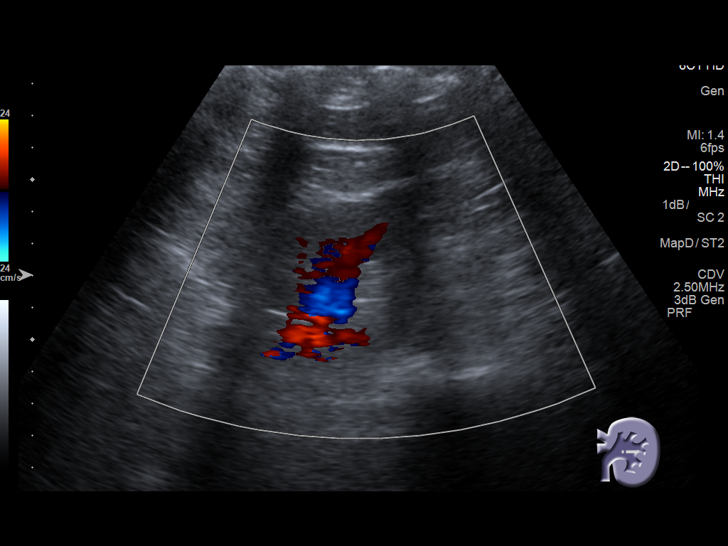
[im 98/98]
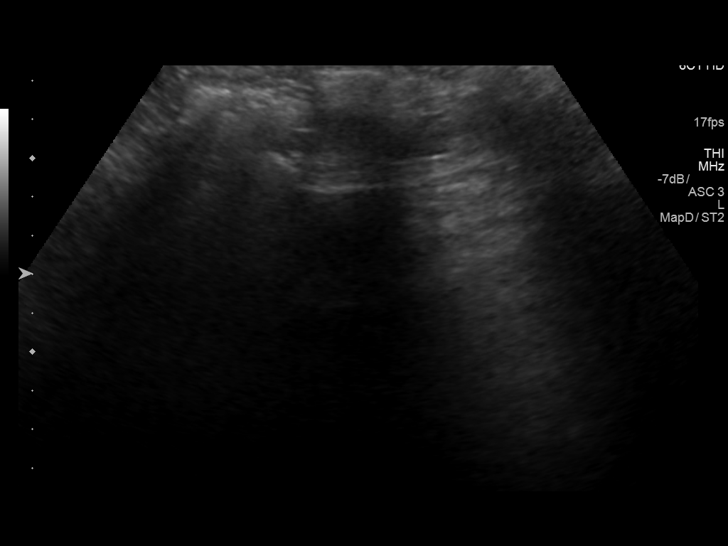

[13 of 25 positions shown; findings below may reference images not displayed]

FINDINGS: Gallbladder: There is a 5 mm echogenic focus along the posterior
wall of the gallbladder which neither moves nor shadows consistent
with a polyp. There is a second echogenic foci us along the
gallbladder wall which neither moves nor shadows measuring just over
2 mm in length. There are no echogenic foci in the gallbladder which
move and shadow as is expected with gallstones. No gallbladder wall
thickening or pericholecystic fluid. No sonographic Murphy sign
noted by sonographer.

Common bile duct: Diameter: 3 mm. No intrahepatic, common hepatic,
or common bile dilatation.

Liver: No focal lesion identified. Liver echogenicity overall is
increased. Portal vein is patent on color Doppler imaging with
normal direction of blood flow towards the liver.

IVC: No abnormality visualized.

Pancreas: Pancreas essentially completely obscured by gas.

Spleen: Size and appearance within normal limits.

Right Kidney: Length: 11.8 cm. Echogenicity within normal limits. No
mass or hydronephrosis visualized.

Left Kidney: Length: 12.1 cm. Echogenicity within normal limits. No
mass or hydronephrosis visualized.

Abdominal aorta: No aneurysm visualized.

Other findings: No demonstrable ascites.
IMPRESSION: 1. Small polyps in the gallbladder, largest measuring 5 mm in
length. Per consensus guidelines, polyps of this small size do not
warrant imaging surveillance. Gallbladder otherwise appears
unremarkable.

2. Diffuse increase in liver echogenicity, a finding indicative of
hepatic steatosis. While no focal liver lesions are evident on this
study, it must be cautioned that the sensitivity of ultrasound for
detection of focal liver lesions is diminished in this circumstance.

3.  Pancreas obscured by gas.

4.  Study otherwise unremarkable.
